# Patient Record
Sex: Female | Born: 1940 | Race: White | Hispanic: No | Marital: Married
Health system: Southern US, Community
[De-identification: ages and names within clinical notes are randomized; demographics above are authoritative.]

---

## 2004-08-15 ENCOUNTER — Ambulatory Visit: Payer: Self-pay

## 2004-08-17 ENCOUNTER — Ambulatory Visit: Payer: Self-pay

## 2004-09-20 ENCOUNTER — Ambulatory Visit: Payer: Self-pay | Admitting: Unknown Physician Specialty

## 2005-07-26 ENCOUNTER — Ambulatory Visit: Payer: Self-pay | Admitting: Podiatry

## 2005-09-23 ENCOUNTER — Ambulatory Visit: Payer: Self-pay | Admitting: Unknown Physician Specialty

## 2006-01-28 ENCOUNTER — Ambulatory Visit: Payer: Self-pay | Admitting: Internal Medicine

## 2006-06-04 ENCOUNTER — Ambulatory Visit: Payer: Self-pay | Admitting: Internal Medicine

## 2006-06-09 ENCOUNTER — Ambulatory Visit: Payer: Self-pay | Admitting: Internal Medicine

## 2006-07-04 ENCOUNTER — Ambulatory Visit: Payer: Self-pay | Admitting: Podiatry

## 2006-12-16 ENCOUNTER — Ambulatory Visit: Payer: Self-pay | Admitting: Unknown Physician Specialty

## 2007-04-29 ENCOUNTER — Ambulatory Visit: Payer: Self-pay | Admitting: Unknown Physician Specialty

## 2007-10-21 ENCOUNTER — Ambulatory Visit: Payer: Self-pay | Admitting: Internal Medicine

## 2007-12-24 ENCOUNTER — Ambulatory Visit: Payer: Self-pay | Admitting: Unknown Physician Specialty

## 2008-09-23 ENCOUNTER — Ambulatory Visit: Payer: Self-pay | Admitting: Internal Medicine

## 2008-10-10 ENCOUNTER — Inpatient Hospital Stay: Payer: Self-pay | Admitting: Internal Medicine

## 2009-01-03 ENCOUNTER — Observation Stay: Payer: Self-pay | Admitting: Internal Medicine

## 2009-01-11 IMAGING — RF DG UGI W/O KUB
1 series · 15 of 17 positions shown · non-contrast
Comparison: none

REASON FOR EXAM: cough
COMMENTS:

[Series 1: run · 15 of 17 slices shown]
[im 1/17]
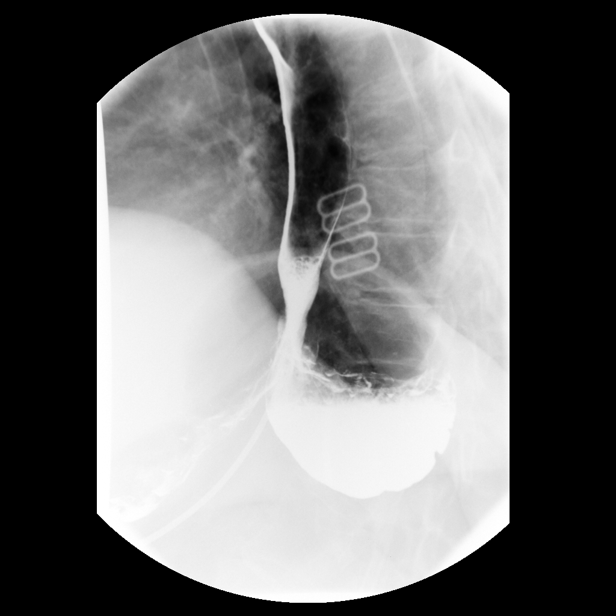
[im 2/17]
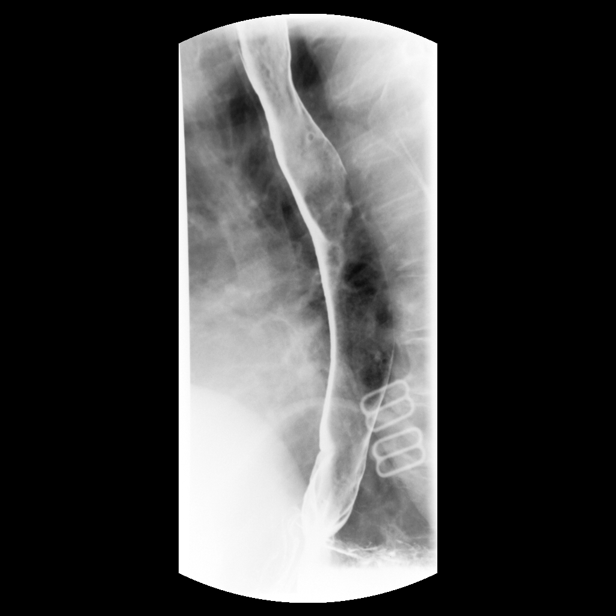
[im 3/17]
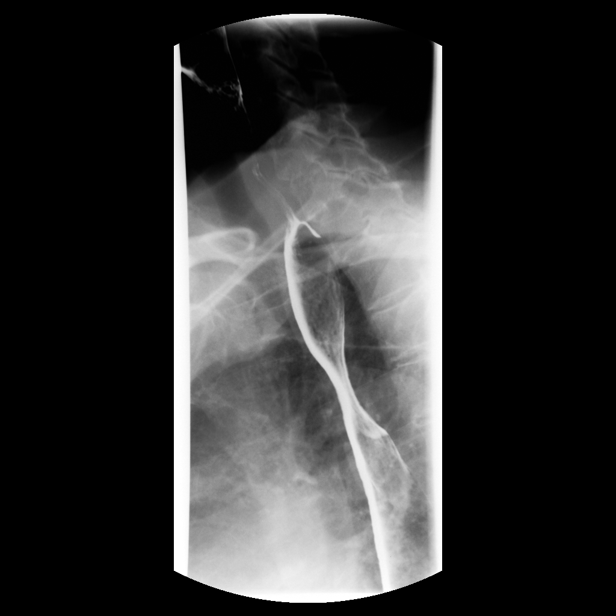
[im 4/17]
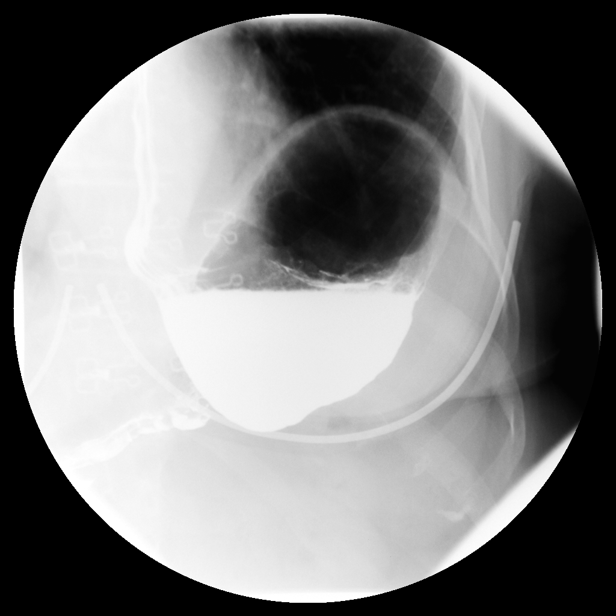
[im 6/17]
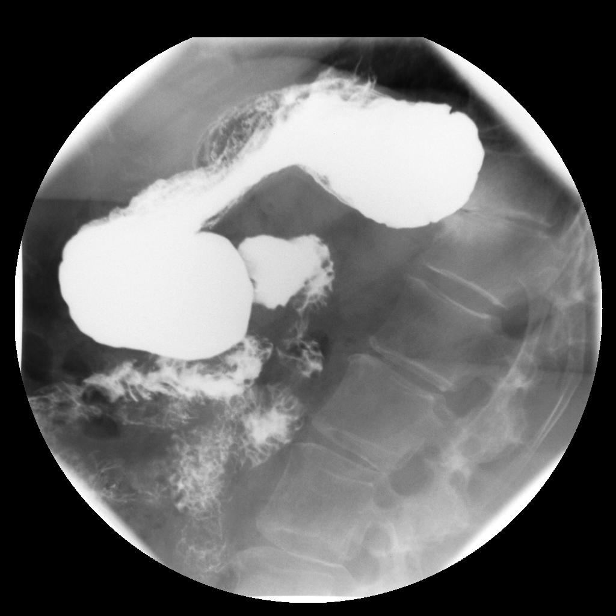
[im 7/17]
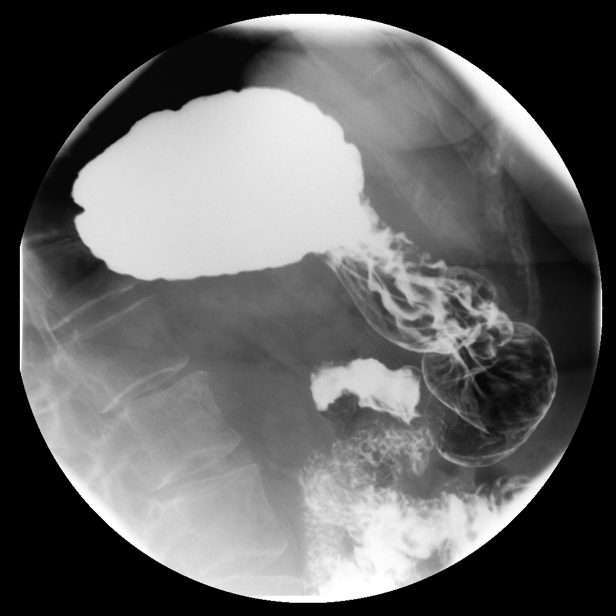
[im 8/17]
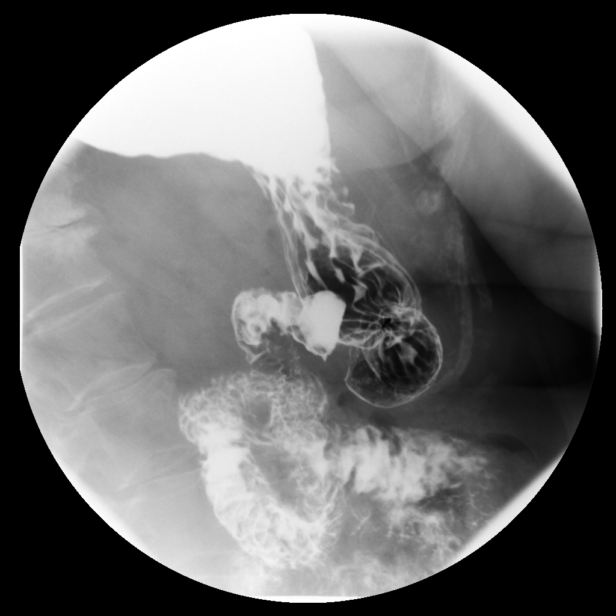
[im 9/17]
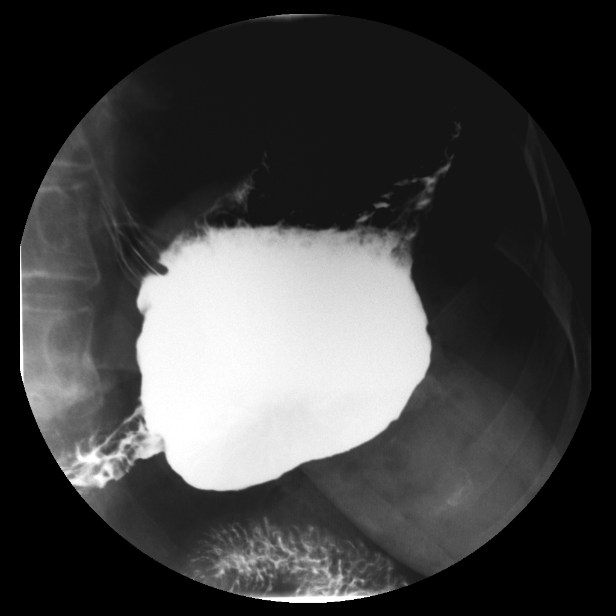
[im 10/17]
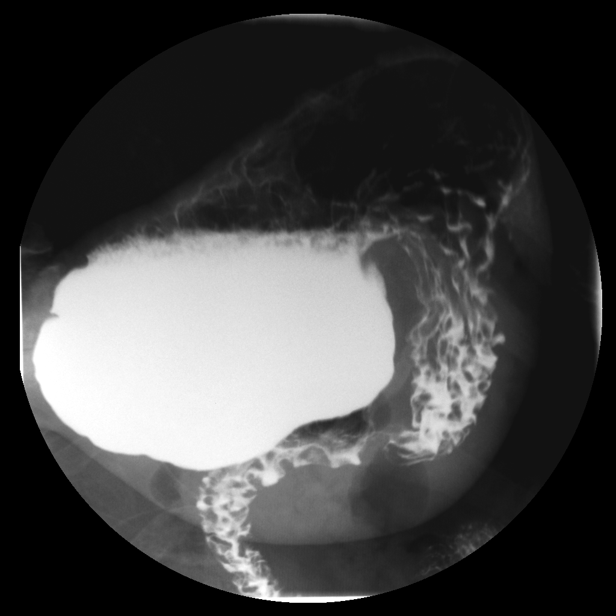
[im 11/17]
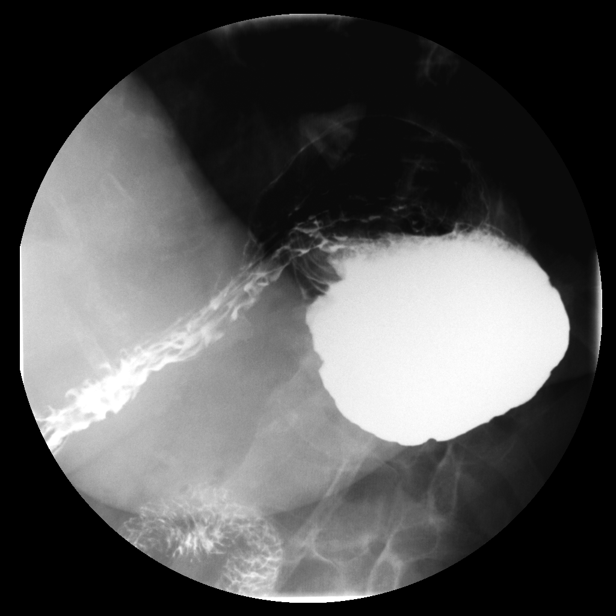
[im 12/17]
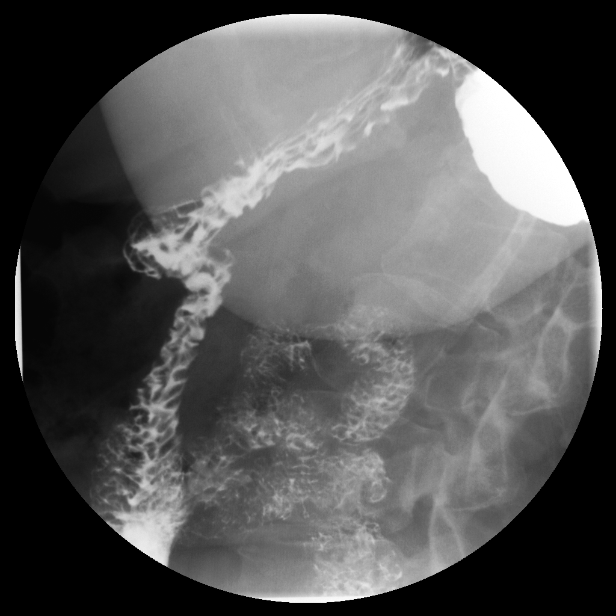
[im 14/17]
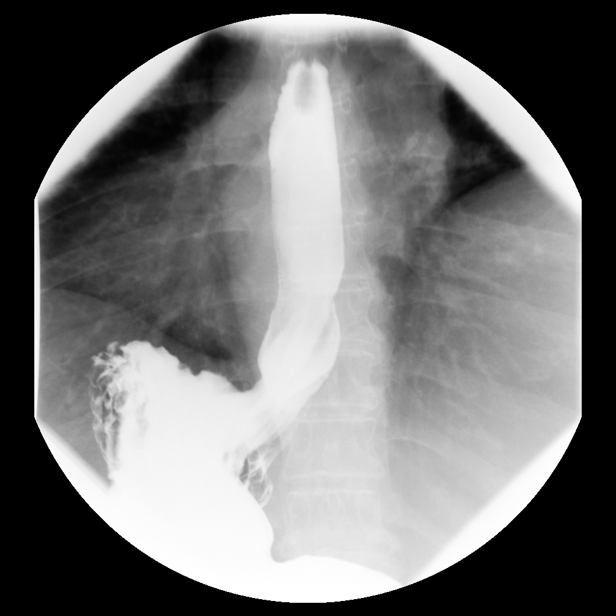
[im 15/17]
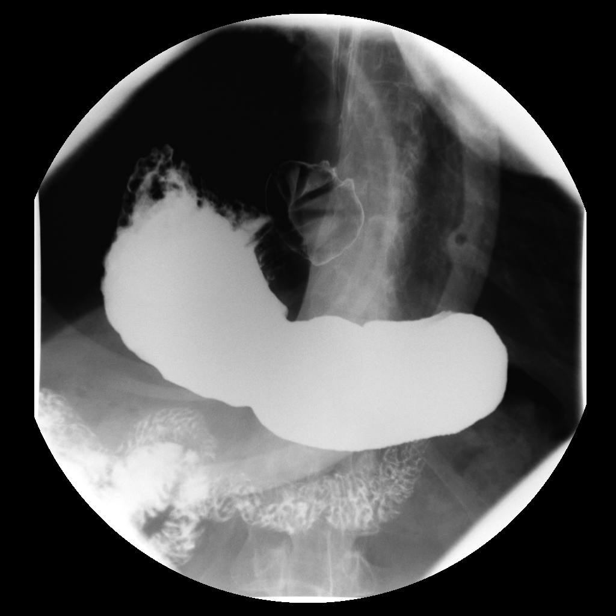
[im 16/17]
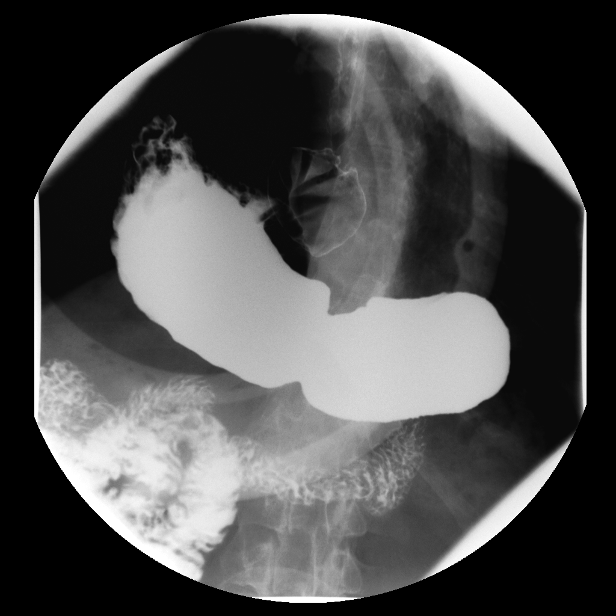
[im 17/17]
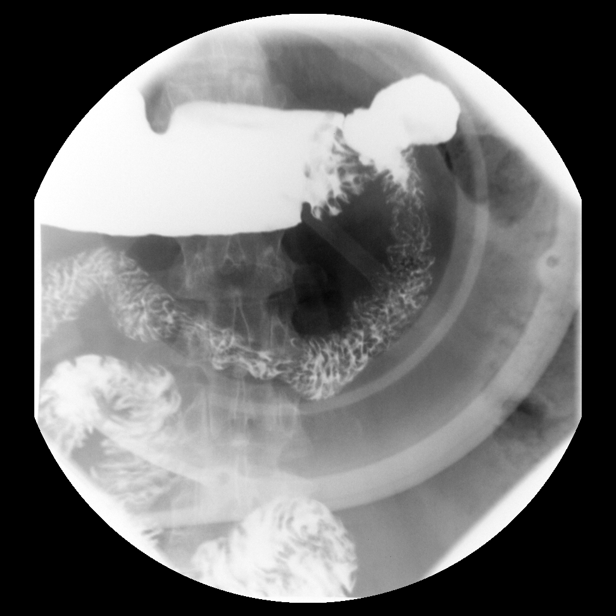

[15 of 17 positions shown; findings below may reference images not displayed]

PROCEDURE:     FL  - FL UPPER GI  - September 23, 2008 [DATE]

RESULT:     The anticipated procedure was discussed with Ms. Jordany. She
voiced her willingness to proceed. The patient ingested barium without
difficulty. Esophageal motility was normal. No significant reflux was
demonstrated. No esophageal stricture or evidence of esophagitis was
demonstrated. A small reducible hiatal hernia was demonstrated.

The stomach distended well and emptied promptly. There was no evidence of
mucosal fold thickening or ulcer niche. No gastric masses were identified.
The 12 mm barium pill passed without difficulty. The duodenal bulb and
C-sweep were normal in appearance.
IMPRESSION: 1. There is a small reducible hiatal hernia. I do not see evidence of reflux
or of esophagitis.
2. The stomach and duodenum were normal in appearance.

## 2009-01-18 ENCOUNTER — Ambulatory Visit: Payer: Self-pay | Admitting: Unknown Physician Specialty

## 2009-02-17 ENCOUNTER — Emergency Department: Payer: Self-pay | Admitting: Emergency Medicine

## 2009-04-26 ENCOUNTER — Ambulatory Visit: Payer: Self-pay | Admitting: Internal Medicine

## 2009-06-07 IMAGING — CR DG CHEST 2V
1 series · 2 of 2 positions shown · non-contrast
Comparison: none

REASON FOR EXAM: cough
COMMENTS:   LMP: Post-Menopausal

[Series 1: view not recorded · 0.17mm/px · 2 of 2 slices shown]
[im 1/2]
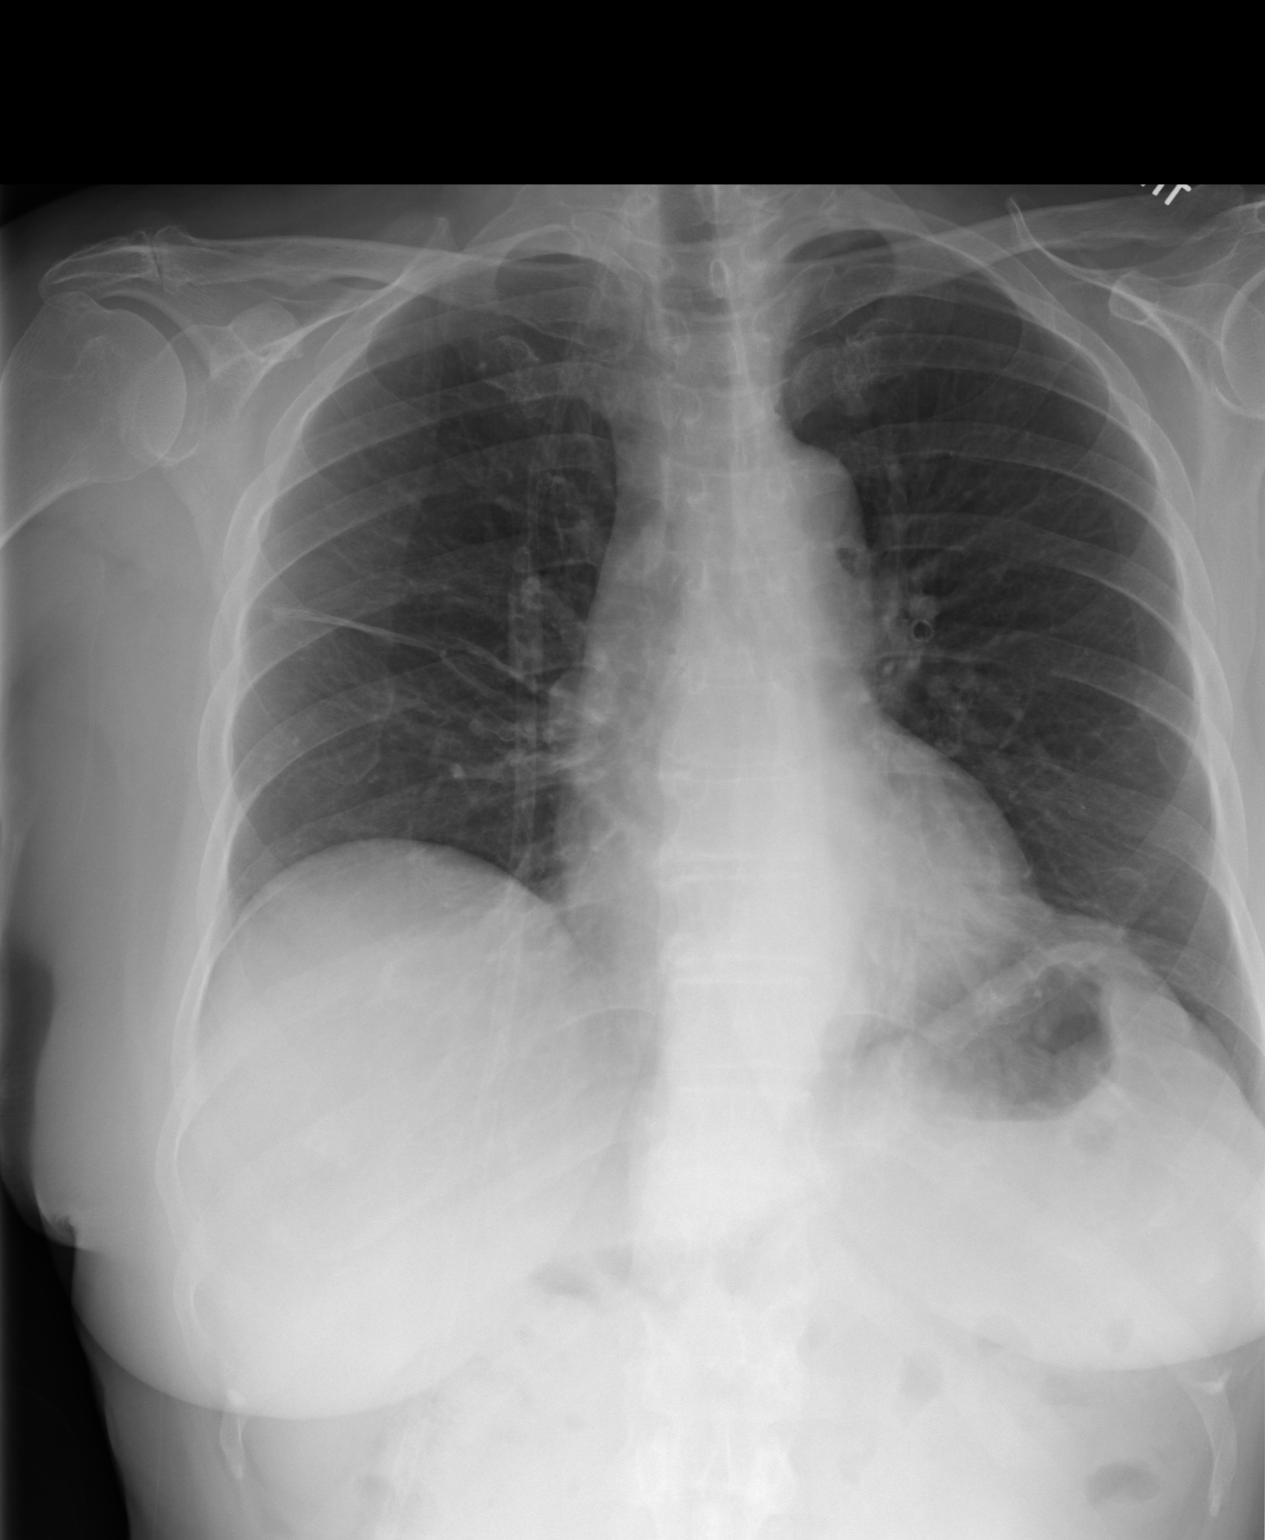
[im 2/2]
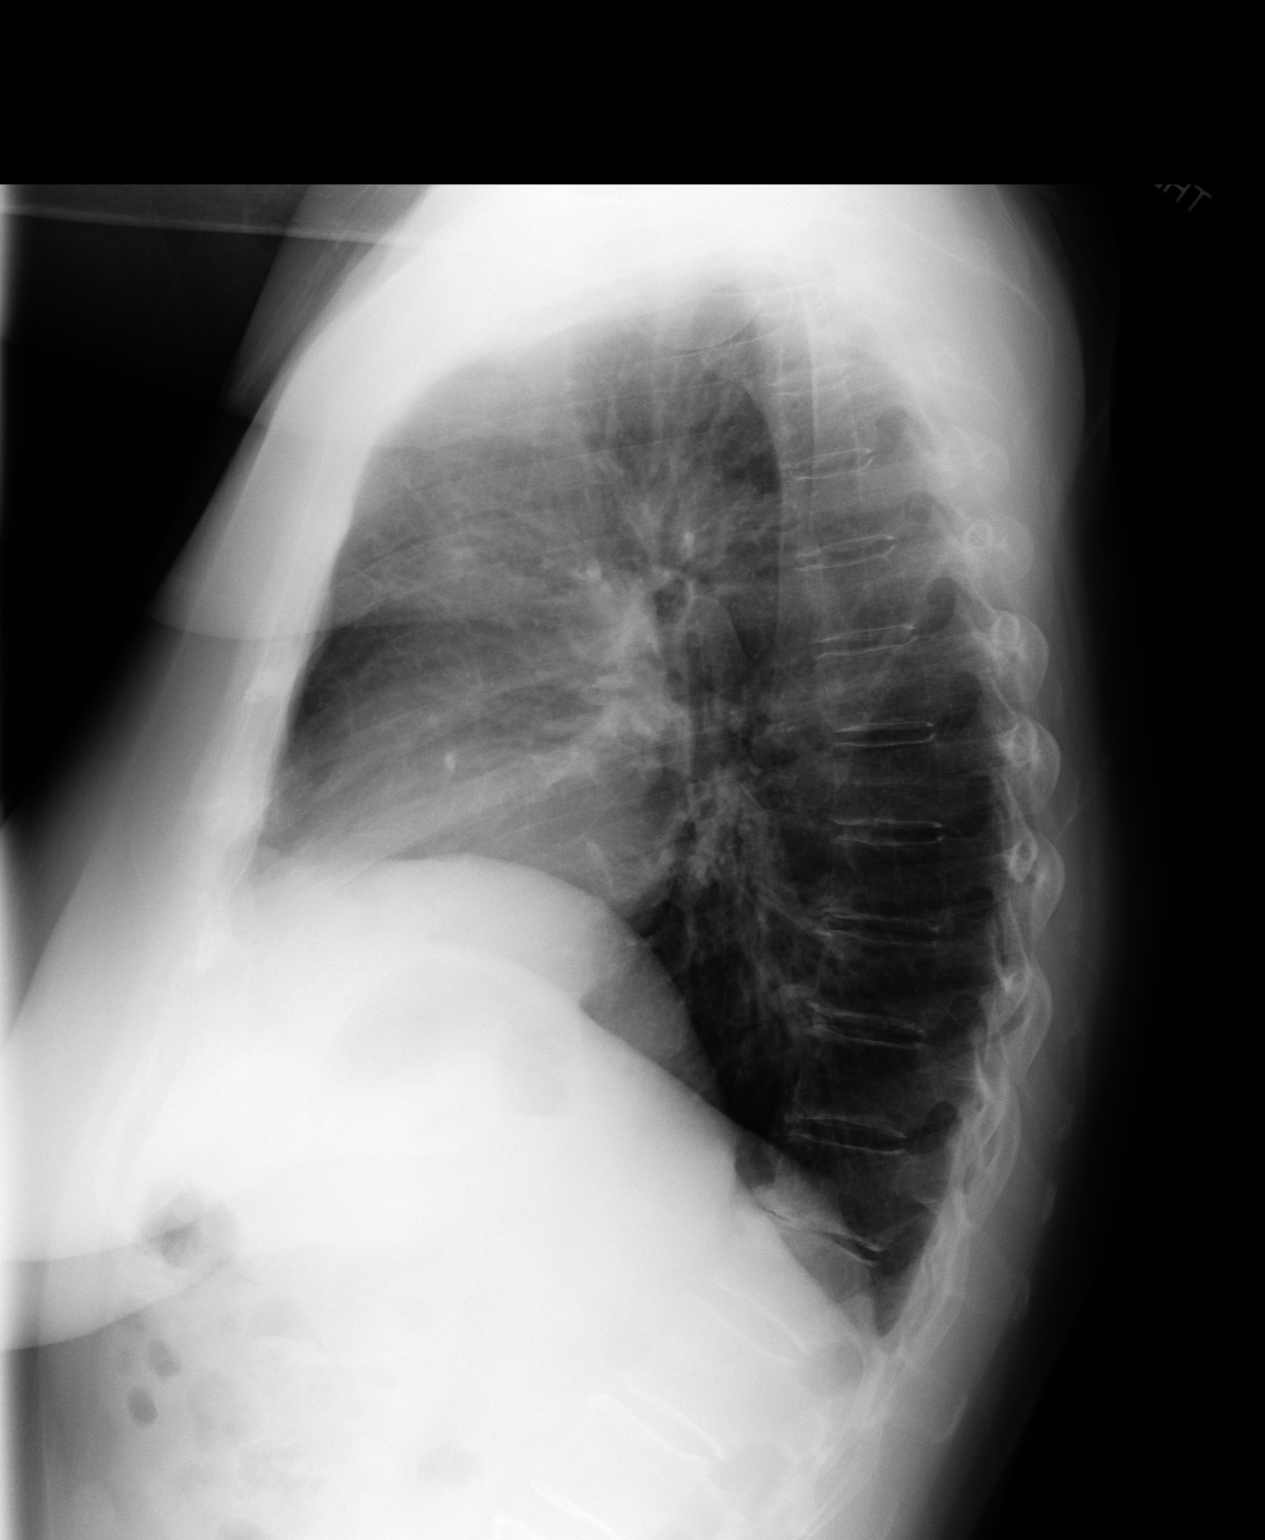

[2 of 2 positions shown; findings below may reference images not displayed]

PROCEDURE:     DXR - DXR CHEST PA (OR AP) AND LATERAL  - February 17, 2009  [DATE]

RESULT:     Comparison is made to a prior study dated 10/10/2008.

Chronic discoid atelectasis projects within the right mid lung. There is no
evidence of focal infiltrates, effusions or edema. The cardiac silhouette
and visualized bony skeleton are unremarkable.
IMPRESSION: Discoid atelectasis on the right without evidence of acute
cardiopulmonary disease.

## 2009-06-07 IMAGING — CT CT CHEST W/ CM
1 series · 16 of 31 positions shown, 20 images · IV contrast (APPLIED)
Comparison: none

REASON FOR EXAM: sob, hi ddimer, hypoxic
COMMENTS:   LMP: Post-Menopausal

[Series 4: soft tissue · axial · 0.74mm/px · z∈[+406,+664]mm · 16 of 94 slices shown, 20 images]
[im 4/94  mediastinal]
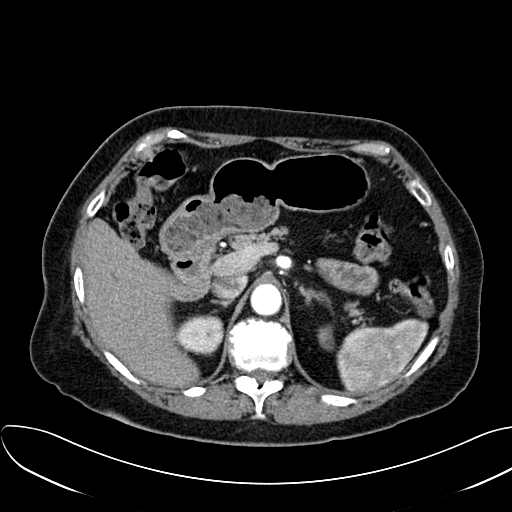
[im 4/94  lung]
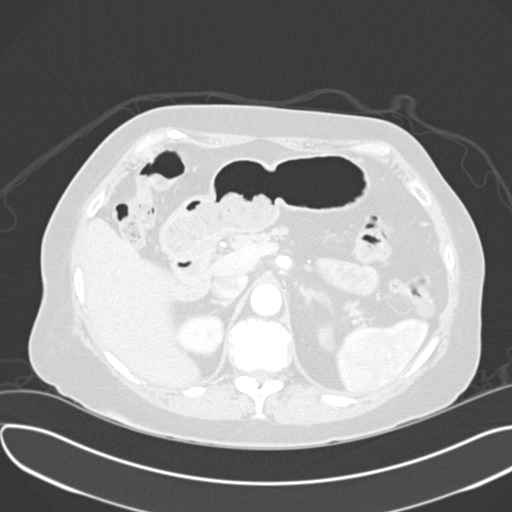
[im 11/94  lung]
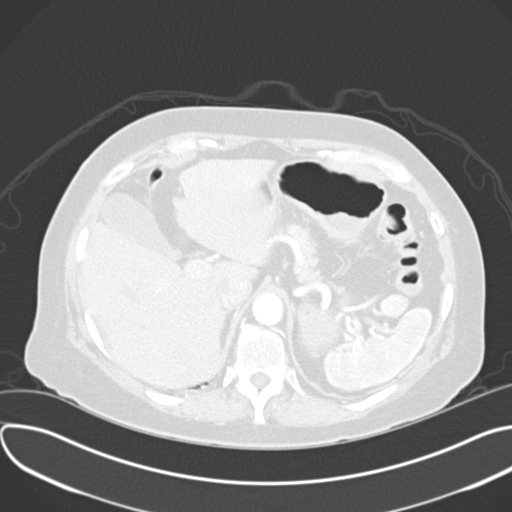
[im 18/94  lung]
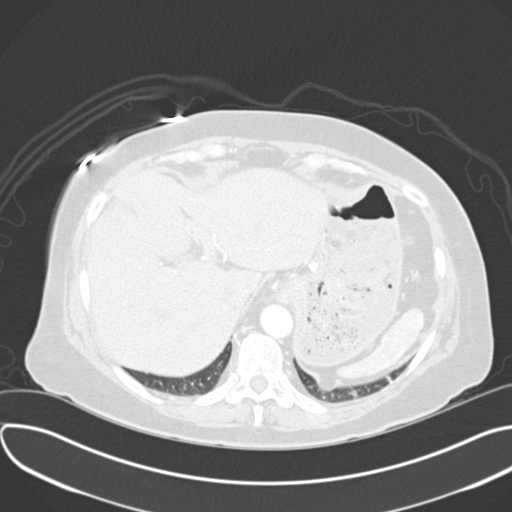
[im 21/94  lung]
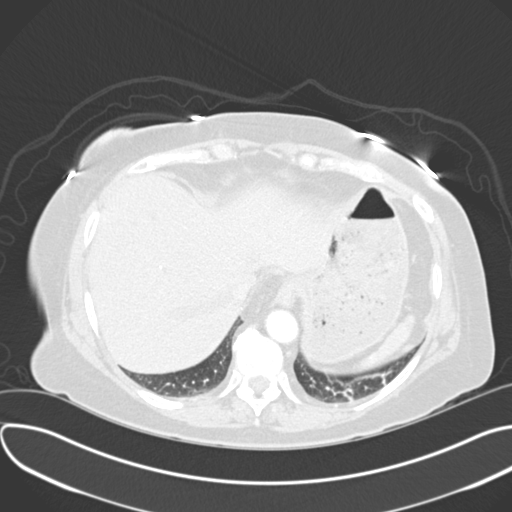
[im 28/94  mediastinal]
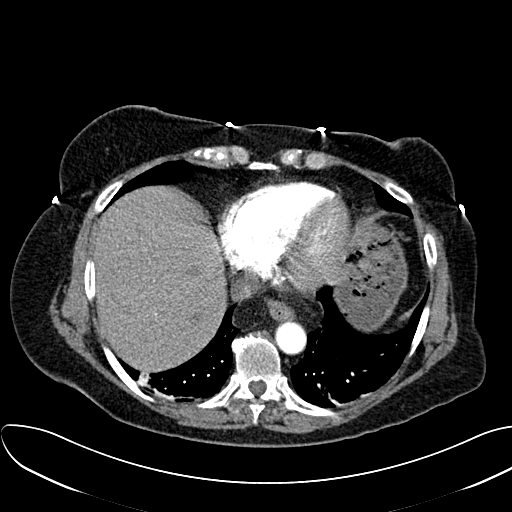
[im 28/94  lung]
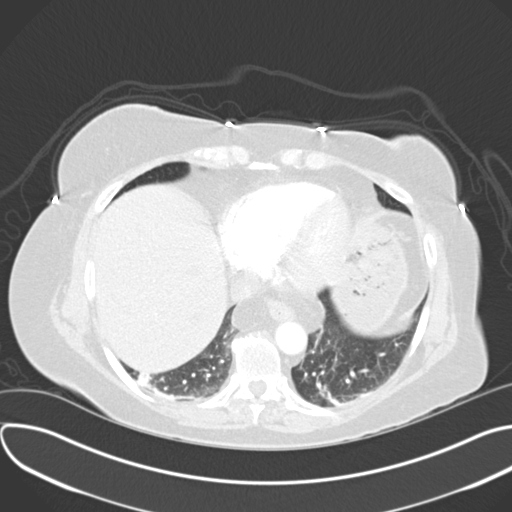
[im 32/94  lung]
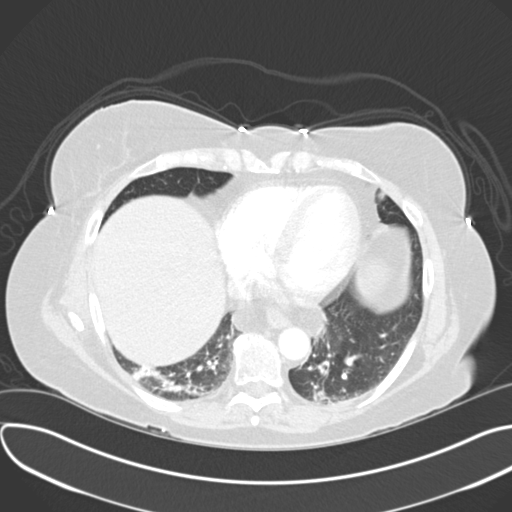
[im 38/94  lung]
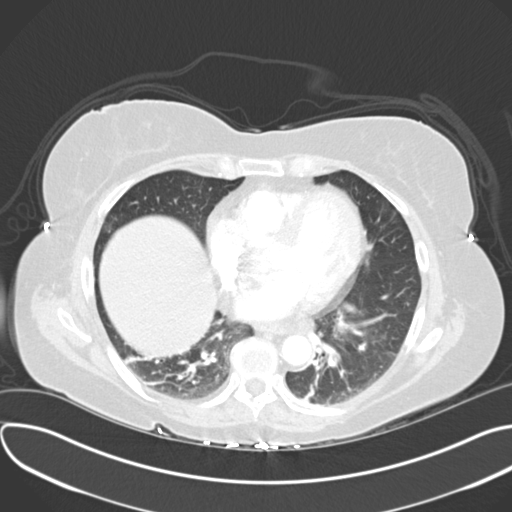
[im 45/94  lung]
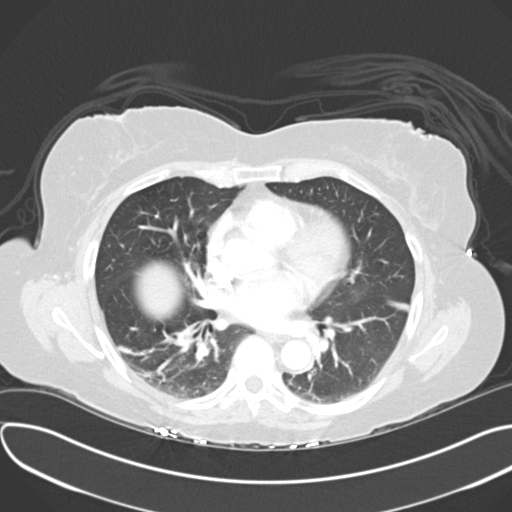
[im 50/94  mediastinal]
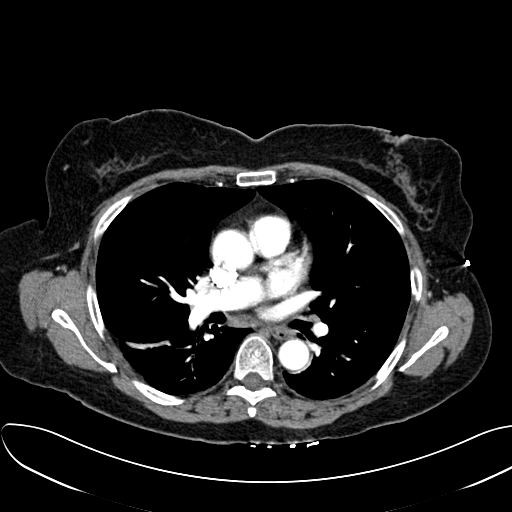
[im 50/94  lung]
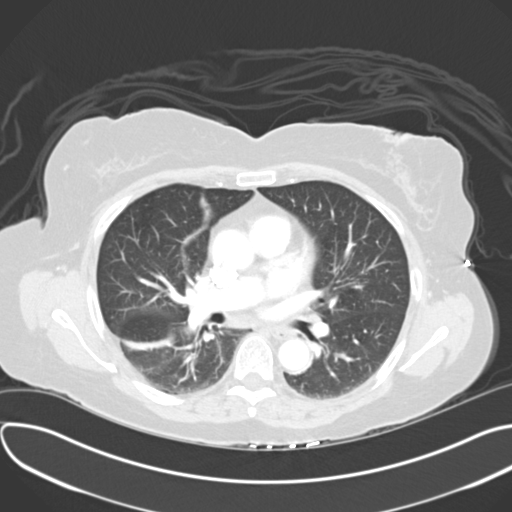
[im 56/94  lung]
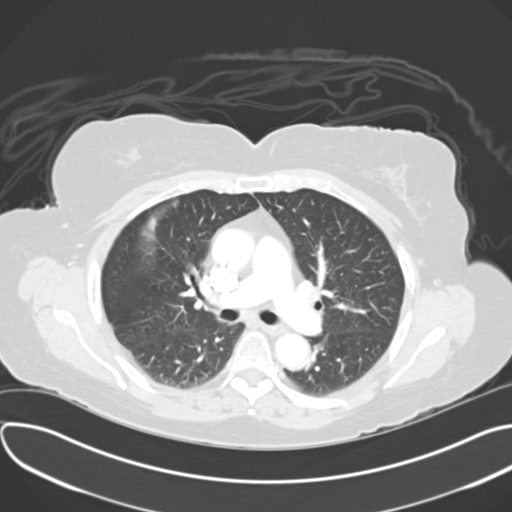
[im 63/94  lung]
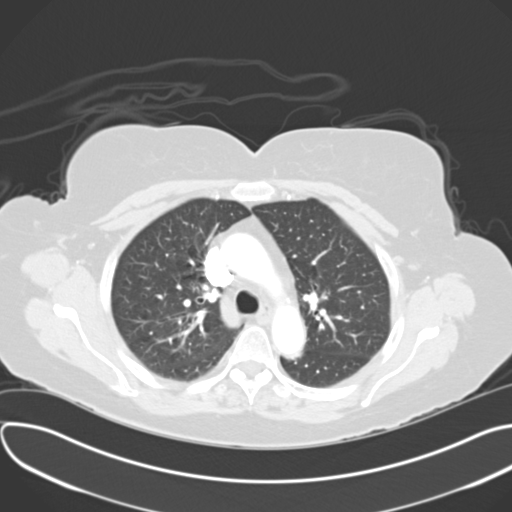
[im 66/94  lung]
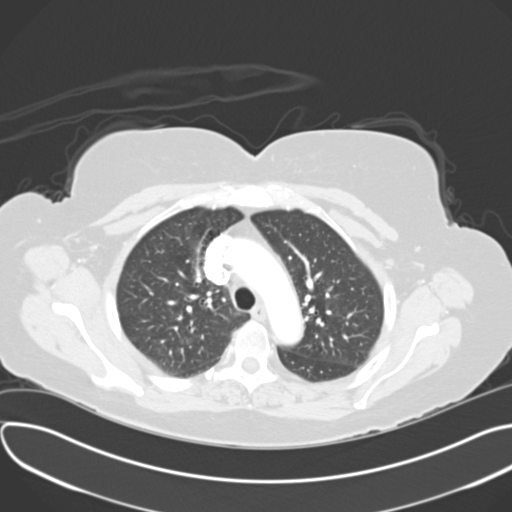
[im 73/94  mediastinal]
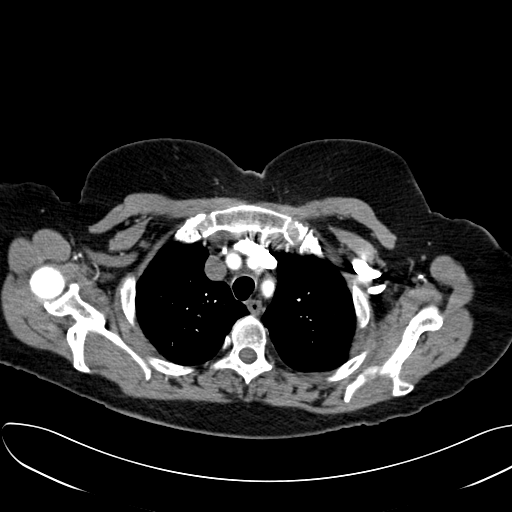
[im 73/94  lung]
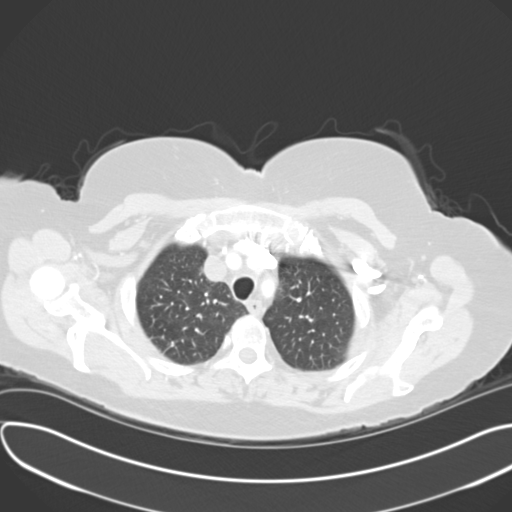
[im 76/94  lung]
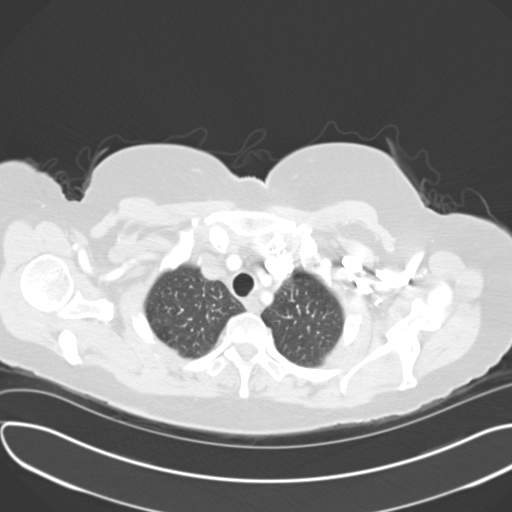
[im 83/94  lung]
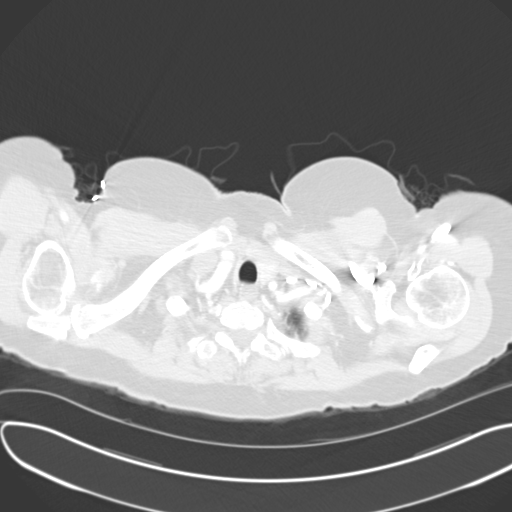
[im 90/94  lung]
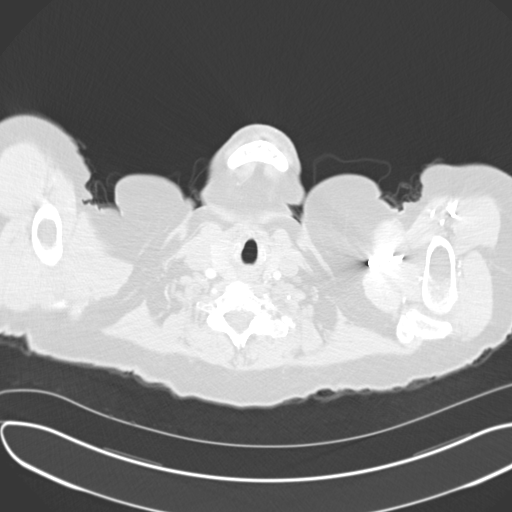

[16 of 31 positions shown; findings below may reference images not displayed]

PROCEDURE:     CT  - CT CHEST WITH CONTRAST  - February 17, 2009  [DATE]

RESULT:     CT of the chest is performed utilizing approximately 85 mL of
Jsovue-GPE iodinated intravenous contrast. Images are reconstructed at 3 mm
slice thickness in the axial plane. There is no previous exam for comparison.

There is atelectasis in both lower lobes and in the lingula. Early
infiltrate is not excluded. There is no pleural or pericardial effusion. The
thoracic aorta is normal in size and opacification with no dissection or
aneurysm. There is no mediastinal or hilar mass. There is no focal lung
mass. There is no pulmonary embolism.

Images through the upper abdomen demonstrate no gross abnormality.
IMPRESSION: 1. Atelectasis at both lung bases.
2. No pulmonary embolism or aortic dissection.

## 2009-06-13 ENCOUNTER — Ambulatory Visit: Payer: Self-pay | Admitting: Unknown Physician Specialty

## 2009-12-06 ENCOUNTER — Ambulatory Visit: Payer: Self-pay | Admitting: Anesthesiology

## 2010-01-19 ENCOUNTER — Ambulatory Visit: Payer: Self-pay | Admitting: Unknown Physician Specialty

## 2010-09-21 ENCOUNTER — Encounter: Payer: Self-pay | Admitting: Internal Medicine

## 2010-09-27 ENCOUNTER — Ambulatory Visit: Payer: Self-pay | Admitting: Orthopedic Surgery

## 2010-10-02 ENCOUNTER — Encounter: Payer: Self-pay | Admitting: Orthopedic Surgery

## 2010-12-18 ENCOUNTER — Other Ambulatory Visit (HOSPITAL_COMMUNITY): Payer: Self-pay | Admitting: Neurology

## 2010-12-18 DIAGNOSIS — R413 Other amnesia: Secondary | ICD-10-CM

## 2010-12-20 ENCOUNTER — Encounter (HOSPITAL_COMMUNITY)
Admission: RE | Admit: 2010-12-20 | Discharge: 2010-12-20 | Disposition: A | Discharge status: Home / Self Care | Payer: No Typology Code available for payment source | Source: Ambulatory Visit | Attending: Neurology | Admitting: Neurology

## 2010-12-20 DIAGNOSIS — R5383 Other fatigue: Secondary | ICD-10-CM | POA: Insufficient documentation

## 2010-12-20 DIAGNOSIS — R4182 Altered mental status, unspecified: Secondary | ICD-10-CM | POA: Insufficient documentation

## 2010-12-20 DIAGNOSIS — R5381 Other malaise: Secondary | ICD-10-CM | POA: Insufficient documentation

## 2010-12-20 DIAGNOSIS — R413 Other amnesia: Secondary | ICD-10-CM | POA: Insufficient documentation

## 2011-01-22 ENCOUNTER — Ambulatory Visit: Payer: Self-pay | Admitting: Internal Medicine

## 2011-09-23 ENCOUNTER — Emergency Department: Payer: Self-pay | Admitting: Emergency Medicine

## 2012-02-06 ENCOUNTER — Ambulatory Visit: Payer: Self-pay

## 2012-11-03 ENCOUNTER — Ambulatory Visit: Payer: Self-pay | Admitting: Internal Medicine

## 2013-02-09 ENCOUNTER — Ambulatory Visit: Payer: Self-pay

## 2013-02-17 ENCOUNTER — Other Ambulatory Visit: Payer: Self-pay | Admitting: Neurology

## 2013-05-04 ENCOUNTER — Emergency Department: Payer: Self-pay | Admitting: Emergency Medicine

## 2013-05-04 LAB — URINALYSIS, COMPLETE
Bilirubin,UR: NEGATIVE
Blood: NEGATIVE
Glucose,UR: NEGATIVE mg/dL (ref 0–75)
Leukocyte Esterase: NEGATIVE
Ph: 6 (ref 4.5–8.0)
Squamous Epithelial: NONE SEEN
WBC UR: 1 /HPF (ref 0–5)

## 2013-05-04 LAB — COMPREHENSIVE METABOLIC PANEL
Albumin: 3.6 g/dL (ref 3.4–5.0)
BUN: 14 mg/dL (ref 7–18)
Bilirubin,Total: 0.6 mg/dL (ref 0.2–1.0)
Chloride: 108 mmol/L — ABNORMAL HIGH (ref 98–107)
Glucose: 96 mg/dL (ref 65–99)
Osmolality: 282 (ref 275–301)
SGOT(AST): 25 U/L (ref 15–37)
SGPT (ALT): 24 U/L (ref 12–78)
Total Protein: 6.8 g/dL (ref 6.4–8.2)

## 2013-05-04 LAB — CBC
HCT: 36.2 % (ref 35.0–47.0)
HGB: 12.3 g/dL (ref 12.0–16.0)
MCH: 30.5 pg (ref 26.0–34.0)
Platelet: 187 10*3/uL (ref 150–440)
RDW: 12.6 % (ref 11.5–14.5)
WBC: 8 10*3/uL (ref 3.6–11.0)

## 2013-05-04 LAB — TROPONIN I: Troponin-I: <0.02 ng/mL

## 2013-05-06 LAB — URINE CULTURE

## 2013-05-14 ENCOUNTER — Other Ambulatory Visit: Payer: Self-pay | Admitting: Internal Medicine

## 2013-05-14 DIAGNOSIS — R1310 Dysphagia, unspecified: Secondary | ICD-10-CM

## 2013-05-20 ENCOUNTER — Other Ambulatory Visit: Payer: Self-pay | Admitting: Internal Medicine

## 2013-05-20 ENCOUNTER — Ambulatory Visit
Admission: RE | Admit: 2013-05-20 | Discharge: 2013-05-20 | Disposition: A | Discharge status: Home / Self Care | Payer: Medicare Other | Source: Ambulatory Visit | Attending: Internal Medicine | Admitting: Internal Medicine

## 2013-05-20 DIAGNOSIS — R1310 Dysphagia, unspecified: Secondary | ICD-10-CM

## 2013-08-03 ENCOUNTER — Other Ambulatory Visit: Payer: Self-pay | Admitting: Internal Medicine

## 2013-08-03 DIAGNOSIS — K219 Gastro-esophageal reflux disease without esophagitis: Secondary | ICD-10-CM

## 2013-08-09 ENCOUNTER — Other Ambulatory Visit: Payer: Medicare Other

## 2016-07-24 DIAGNOSIS — G309 Alzheimer's disease, unspecified: Secondary | ICD-10-CM | POA: Diagnosis not present

## 2016-07-24 DIAGNOSIS — F411 Generalized anxiety disorder: Secondary | ICD-10-CM | POA: Diagnosis not present

## 2016-07-24 DIAGNOSIS — Z9189 Other specified personal risk factors, not elsewhere classified: Secondary | ICD-10-CM | POA: Diagnosis not present

## 2016-07-24 DIAGNOSIS — R54 Age-related physical debility: Secondary | ICD-10-CM | POA: Diagnosis not present

## 2016-07-24 DIAGNOSIS — I10 Essential (primary) hypertension: Secondary | ICD-10-CM | POA: Diagnosis not present

## 2016-08-21 DIAGNOSIS — R296 Repeated falls: Secondary | ICD-10-CM | POA: Diagnosis not present

## 2016-08-21 DIAGNOSIS — G308 Other Alzheimer's disease: Secondary | ICD-10-CM | POA: Diagnosis not present

## 2016-08-21 DIAGNOSIS — Z515 Encounter for palliative care: Secondary | ICD-10-CM | POA: Diagnosis not present

## 2016-08-21 DIAGNOSIS — R627 Adult failure to thrive: Secondary | ICD-10-CM | POA: Diagnosis not present

## 2016-08-21 DIAGNOSIS — G309 Alzheimer's disease, unspecified: Secondary | ICD-10-CM | POA: Diagnosis not present

## 2016-09-03 DIAGNOSIS — G308 Other Alzheimer's disease: Secondary | ICD-10-CM | POA: Diagnosis not present

## 2016-09-20 DIAGNOSIS — R296 Repeated falls: Secondary | ICD-10-CM | POA: Diagnosis not present

## 2016-09-20 DIAGNOSIS — G308 Other Alzheimer's disease: Secondary | ICD-10-CM | POA: Diagnosis not present

## 2016-09-24 DIAGNOSIS — Z79899 Other long term (current) drug therapy: Secondary | ICD-10-CM | POA: Diagnosis not present

## 2016-10-03 DIAGNOSIS — G308 Other Alzheimer's disease: Secondary | ICD-10-CM | POA: Diagnosis not present

## 2016-11-03 DIAGNOSIS — G308 Other Alzheimer's disease: Secondary | ICD-10-CM | POA: Diagnosis not present

## 2016-11-20 DIAGNOSIS — G309 Alzheimer's disease, unspecified: Secondary | ICD-10-CM | POA: Diagnosis not present

## 2016-11-20 DIAGNOSIS — I10 Essential (primary) hypertension: Secondary | ICD-10-CM | POA: Diagnosis not present

## 2016-11-20 DIAGNOSIS — J45998 Other asthma: Secondary | ICD-10-CM | POA: Diagnosis not present

## 2016-11-20 DIAGNOSIS — K219 Gastro-esophageal reflux disease without esophagitis: Secondary | ICD-10-CM | POA: Diagnosis not present

## 2016-12-04 DIAGNOSIS — G308 Other Alzheimer's disease: Secondary | ICD-10-CM | POA: Diagnosis not present

## 2016-12-25 DIAGNOSIS — I10 Essential (primary) hypertension: Secondary | ICD-10-CM | POA: Diagnosis not present

## 2016-12-25 DIAGNOSIS — G309 Alzheimer's disease, unspecified: Secondary | ICD-10-CM | POA: Diagnosis not present

## 2016-12-25 DIAGNOSIS — K219 Gastro-esophageal reflux disease without esophagitis: Secondary | ICD-10-CM | POA: Diagnosis not present

## 2016-12-25 DIAGNOSIS — R630 Anorexia: Secondary | ICD-10-CM | POA: Diagnosis not present

## 2016-12-30 DIAGNOSIS — R54 Age-related physical debility: Secondary | ICD-10-CM | POA: Diagnosis not present

## 2016-12-30 DIAGNOSIS — G308 Other Alzheimer's disease: Secondary | ICD-10-CM | POA: Diagnosis not present

## 2016-12-30 DIAGNOSIS — F0390 Unspecified dementia without behavioral disturbance: Secondary | ICD-10-CM | POA: Diagnosis not present

## 2016-12-30 DIAGNOSIS — R627 Adult failure to thrive: Secondary | ICD-10-CM | POA: Diagnosis not present

## 2017-01-01 DIAGNOSIS — G308 Other Alzheimer's disease: Secondary | ICD-10-CM | POA: Diagnosis not present

## 2017-01-24 DIAGNOSIS — Z79899 Other long term (current) drug therapy: Secondary | ICD-10-CM | POA: Diagnosis not present

## 2017-01-29 DIAGNOSIS — D72829 Elevated white blood cell count, unspecified: Secondary | ICD-10-CM | POA: Diagnosis not present

## 2017-01-29 DIAGNOSIS — N390 Urinary tract infection, site not specified: Secondary | ICD-10-CM | POA: Diagnosis not present

## 2017-02-01 DIAGNOSIS — G308 Other Alzheimer's disease: Secondary | ICD-10-CM | POA: Diagnosis not present

## 2017-03-03 DIAGNOSIS — G308 Other Alzheimer's disease: Secondary | ICD-10-CM | POA: Diagnosis not present

## 2017-03-31 DIAGNOSIS — Z515 Encounter for palliative care: Secondary | ICD-10-CM | POA: Diagnosis not present

## 2017-03-31 DIAGNOSIS — G308 Other Alzheimer's disease: Secondary | ICD-10-CM | POA: Diagnosis not present

## 2017-03-31 DIAGNOSIS — R54 Age-related physical debility: Secondary | ICD-10-CM | POA: Diagnosis not present

## 2017-03-31 DIAGNOSIS — F0390 Unspecified dementia without behavioral disturbance: Secondary | ICD-10-CM | POA: Diagnosis not present

## 2017-04-03 DIAGNOSIS — G308 Other Alzheimer's disease: Secondary | ICD-10-CM | POA: Diagnosis not present

## 2017-05-03 DIAGNOSIS — G308 Other Alzheimer's disease: Secondary | ICD-10-CM | POA: Diagnosis not present

## 2017-05-22 DIAGNOSIS — Z79899 Other long term (current) drug therapy: Secondary | ICD-10-CM | POA: Diagnosis not present

## 2017-05-26 DIAGNOSIS — Z79899 Other long term (current) drug therapy: Secondary | ICD-10-CM | POA: Diagnosis not present

## 2017-06-03 DIAGNOSIS — R54 Age-related physical debility: Secondary | ICD-10-CM | POA: Diagnosis not present

## 2017-06-03 DIAGNOSIS — Z515 Encounter for palliative care: Secondary | ICD-10-CM | POA: Diagnosis not present

## 2017-06-03 DIAGNOSIS — G308 Other Alzheimer's disease: Secondary | ICD-10-CM | POA: Diagnosis not present

## 2017-06-03 DIAGNOSIS — F0390 Unspecified dementia without behavioral disturbance: Secondary | ICD-10-CM | POA: Diagnosis not present

## 2017-06-04 DIAGNOSIS — G308 Other Alzheimer's disease: Secondary | ICD-10-CM | POA: Diagnosis not present

## 2017-06-04 DIAGNOSIS — S60221A Contusion of right hand, initial encounter: Secondary | ICD-10-CM | POA: Diagnosis not present

## 2017-06-04 DIAGNOSIS — I10 Essential (primary) hypertension: Secondary | ICD-10-CM | POA: Diagnosis not present

## 2017-06-04 DIAGNOSIS — K219 Gastro-esophageal reflux disease without esophagitis: Secondary | ICD-10-CM | POA: Diagnosis not present

## 2017-07-30 DIAGNOSIS — I10 Essential (primary) hypertension: Secondary | ICD-10-CM | POA: Diagnosis not present

## 2017-07-30 DIAGNOSIS — G308 Other Alzheimer's disease: Secondary | ICD-10-CM | POA: Diagnosis not present

## 2017-07-30 DIAGNOSIS — S60221D Contusion of right hand, subsequent encounter: Secondary | ICD-10-CM | POA: Diagnosis not present

## 2017-07-30 DIAGNOSIS — R29810 Facial weakness: Secondary | ICD-10-CM | POA: Diagnosis not present

## 2017-11-24 DIAGNOSIS — Z66 Do not resuscitate: Secondary | ICD-10-CM | POA: Diagnosis not present

## 2017-11-24 DIAGNOSIS — R404 Transient alteration of awareness: Secondary | ICD-10-CM | POA: Diagnosis not present

## 2017-11-24 DIAGNOSIS — Z7401 Bed confinement status: Secondary | ICD-10-CM | POA: Diagnosis not present

## 2017-11-24 DIAGNOSIS — Z79899 Other long term (current) drug therapy: Secondary | ICD-10-CM | POA: Diagnosis not present

## 2017-11-24 DIAGNOSIS — R4701 Aphasia: Secondary | ICD-10-CM | POA: Diagnosis not present

## 2017-11-24 DIAGNOSIS — I10 Essential (primary) hypertension: Secondary | ICD-10-CM | POA: Diagnosis not present

## 2017-11-24 DIAGNOSIS — F0390 Unspecified dementia without behavioral disturbance: Secondary | ICD-10-CM | POA: Diagnosis not present

## 2017-11-24 DIAGNOSIS — R4182 Altered mental status, unspecified: Secondary | ICD-10-CM | POA: Diagnosis not present

## 2017-11-24 DIAGNOSIS — Z880 Allergy status to penicillin: Secondary | ICD-10-CM | POA: Diagnosis not present

## 2017-11-24 DIAGNOSIS — J45909 Unspecified asthma, uncomplicated: Secondary | ICD-10-CM | POA: Diagnosis not present

## 2017-11-26 DIAGNOSIS — G308 Other Alzheimer's disease: Secondary | ICD-10-CM | POA: Diagnosis not present

## 2017-11-26 DIAGNOSIS — R4182 Altered mental status, unspecified: Secondary | ICD-10-CM | POA: Diagnosis not present

## 2017-11-28 DIAGNOSIS — Z79899 Other long term (current) drug therapy: Secondary | ICD-10-CM | POA: Diagnosis not present

## 2017-12-01 DIAGNOSIS — Z79899 Other long term (current) drug therapy: Secondary | ICD-10-CM | POA: Diagnosis not present

## 2017-12-08 DIAGNOSIS — Z79899 Other long term (current) drug therapy: Secondary | ICD-10-CM | POA: Diagnosis not present

## 2017-12-11 DIAGNOSIS — F0390 Unspecified dementia without behavioral disturbance: Secondary | ICD-10-CM | POA: Diagnosis not present

## 2017-12-11 DIAGNOSIS — Z515 Encounter for palliative care: Secondary | ICD-10-CM | POA: Diagnosis not present

## 2018-01-02 DIAGNOSIS — Z515 Encounter for palliative care: Secondary | ICD-10-CM | POA: Diagnosis not present

## 2018-01-02 DIAGNOSIS — F0390 Unspecified dementia without behavioral disturbance: Secondary | ICD-10-CM | POA: Diagnosis not present

## 2018-07-16 DIAGNOSIS — Z79899 Other long term (current) drug therapy: Secondary | ICD-10-CM | POA: Diagnosis not present

## 2019-02-12 DEATH — deceased

## 2019-05-25 ENCOUNTER — Telehealth: Payer: Self-pay

## 2019-05-25 NOTE — Telephone Encounter (Signed)
Note °  °Per [employer], we left message with the patient requesting the information of their PCP, as we are incorrectly listed as the PCP. In addition, we asked the patient to also inform their insurance as to who their current PCP is. We asked the patient to please call us back  °  ° ° °
